# Patient Record
Sex: Male | Born: 1985 | Race: White | Hispanic: No | Marital: Married | Smoking: Never smoker
Health system: Southern US, Community
[De-identification: ages and names within clinical notes are randomized; demographics above are authoritative.]

## PROBLEM LIST (undated history)

## (undated) HISTORY — PX: ANTERIOR CRUCIATE LIGAMENT REPAIR: SHX115

---
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
## 2004-02-12 ENCOUNTER — Ambulatory Visit (HOSPITAL_BASED_OUTPATIENT_CLINIC_OR_DEPARTMENT_OTHER): Admission: RE | Admit: 2004-02-12 | Discharge: 2004-02-12 | Payer: Self-pay | Admitting: Orthopaedic Surgery

## 2006-08-10 ENCOUNTER — Ambulatory Visit: Payer: Self-pay | Admitting: Internal Medicine

## 2008-12-26 ENCOUNTER — Emergency Department (HOSPITAL_COMMUNITY): Admission: EM | Admit: 2008-12-26 | Discharge: 2008-12-26 | Payer: Self-pay | Admitting: Emergency Medicine

## 2009-06-28 ENCOUNTER — Emergency Department (HOSPITAL_COMMUNITY): Admission: EM | Admit: 2009-06-28 | Discharge: 2009-06-28 | Payer: Self-pay | Admitting: Emergency Medicine

## 2011-05-12 NOTE — Op Note (Signed)
NAMEHORICE, CARRERO NO.:  [id number]   MEDICAL RECORD NO.:  [id number]                   PATIENT TYPE:  AMB   LOCATION:  DSC                                  FACILITY:  [facility]   PHYSICIAN:  Claude Manges. Cleophas Dunker, M.D.            DATE OF BIRTH:  01-18-1986   DATE OF PROCEDURE:  02/12/2004  DATE OF DISCHARGE:                                 OPERATIVE REPORT   PREOPERATIVE DIAGNOSIS:  Retained foreign body (presumed meniscal dart) left  knee.   POSTOPERATIVE DIAGNOSIS:  Retained foreign body (presumed meniscal dart)  left knee.   PROCEDURE:  Excision of meniscal dart (foreign body) left knee.   SURGEON:  Claude Manges. Cleophas Dunker, M.D.   ANESTHESIA:  Local 0.25% Marcaine with epinephrine and IV sedation.   COMPLICATIONS:  None.   HISTORY:  26 year old young man is four months status post ACL  reconstruction of his left knee in association with repair of a medial  meniscal tear using meniscal darts.  He has done very well in his  postoperative course and only developed a small lump or mass along the  medial aspect of his knee in the vicinity of the posterior knee joint.  The  mass has interfered with his activities, particularly his rehab and early  running activities.  The mass is freely mobile and I suspect is a retained  meniscal dart.   DESCRIPTION OF PROCEDURE:  With the patient comfortable on the operating  table and under minimal IV sedation, the left knee was prepped with DuraPrep  from the mid calf to the mid thigh.  Sterile draping was performed.  The  small mass was located in the posterior aspect of the medial joint line and  had been isolated by finger palpation and marking preoperatively with a  pencil.  About a 3/4 inch incision was outlined directly over the mass and  this area was infiltrated with 0.25% Marcaine with epinephrine.  A 15 blade  knife was used to incise the skin and then by blunt dissection, the soft  tissue was carefully  elevated off the mass.  Retractors were inserted.  The  capsule was then minimally incised and then the meniscal dart was clearly  visible and removed in one piece.  There was some mild inflammatory tissue  surrounding it which was excised, as well.  The wound was irrigated with  saline solution.  The subcu was closed with 2-0  Vicryl.  The skin was  closed with a running 4-0 subcuticular nylon with Steri-Strips over Benzoin.  A sterile, soft bulky dressing was applied.  The patient tolerated the  procedure without complications.   PLAN:  Vicodin for pain.  He is a Consulting civil engineer at [employer] and hopefully  they can remove the stitch in approximately a week and then he can continue  with his ACL rehab.  Claude Manges. Cleophas Dunker, M.D.    PWW/MEDQ  D:  02/12/2004  T:  02/12/2004  Job:  [id number]

## 2011-05-12 NOTE — Op Note (Signed)
NAMEMANDELL, PANGBORN NO.:  [id number]   MEDICAL RECORD NO.:  [id number]                   PATIENT TYPE:  AMB   LOCATION:  DSC                                  FACILITY:  [facility]   PHYSICIAN:  Claude Manges. Cleophas Dunker, M.D.            DATE OF BIRTH:  1986-12-25   DATE OF PROCEDURE:  10/09/2003  DATE OF DISCHARGE:                                 OPERATIVE REPORT   PREOPERATIVE DIAGNOSIS:  Tear medial meniscus anterior of anterior cruciate  ligament, left knee.   POSTOPERATIVE DIAGNOSIS:  Tear medial meniscus anterior of anterior cruciate  ligament, left knee.   PROCEDURE:  1. Arthroscopic repair of medial meniscus, left knee.  2. Arthroscopically aided autologous anterior cruciate ligament bone-tendon-     bone reconstruction, (middle third patellar tendon).   SURGEON:  Claude Manges. Cleophas Dunker, M.D.   ASSISTANTDyke Brackett, M.D.   ANESTHESIA:  General orotracheal.   COMPLICATIONS:  None.   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  He has had an MRI scan revealing a tear of the medial meniscus and a  tear of the ACL.  He has been [place] his knee and has now reached full  range of motion and is to have an ACL repair as well as repair of the medial  meniscal tear.   DESCRIPTION OF PROCEDURE:  With the patient comfortable on the operating  room table and under general orotracheal anesthesia, the thigh tourniquet  and thigh holder were applied.  The leg was then prepped with Duraprep from  the thigh holder to the ankle, sterile draping was performed.   The extremity was elevated and Esmarch exsanguinated with the proximal  tourniquet at 350 mmHg.   Diagnostic arthroscopy was performed.  There was minimal effusion though I  did not see any loose bodies.  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  The patella  and both gutters were clear. The  lateral compartment was clear of meniscal pathology or chondromalacia.  The  ACL was torn from its femoral attachment and very thin, although he did have  only a 1 or 2 mm anterior drawer sign.  There was a tear of the medial  meniscus from its mid portion to the posterior horn.  I could easily insert  the probe and dislodge the meniscus.  It was just barely in the white zone  although I could see some vascular structures within the periphery.  The  clear fix arthroscopic screws were used to repair.  We thought we had a very  nice repair, three in total were used.   The ACL was then prepared for reconstruction.  A notch plasty was performed,  removing the old fibers of the ACL and using the hooded bur to remove bone  from the lateral femoral condyle.  The Arthrex guide system was utilized.   An about a 2 inch incision was made in the midline between the inferior pole  of the patella and tibial tubercle.  Very sharp dissection was taken down  the subcutaneous tissue.  The soft tissue and sheath were bluntly dissected  off the patellar ligament.  The patellar ligament measured a little over 30  mm in width, curling in the middle third was utilized for the graft for bone  on either end.  The double-bladed 10 mm knife blade was then used to make  the initial cut and then the oscillating saw used to remove the bone plugs  from the distal patella and the proximal tibia.  An osteotome was used to  dislodge the bony fragments.  We had a very nice graft, measured 10 mm plugs  at both ends and 90 mm in length and 10 mm wide.  Fibrewire was attached x2  in both bone plugs.   The Arthrex guide was then placed in the medial parapatellar incision.  We  had excellent position.  The guide pin was inserted and the 10 mm hole made.  The femoral guide was then inserted and the bone was very hard in the back  so we continued a more extensive notch plasty so that we could  see perfectly  in the back.  The guide was then inserted and the deep guide pin was then  inserted.  The 10 mm drill was utilized to make a foot print.  We checked to  see that we had full bone and then a 32 or 33 mm drill hole was then made.  We again checked and felt we had perfectly good bone circumferentially.   The graft was then, Fibrewires were then placed through the eye of the beeth  needle and then placed through the tibial notch, through the joint into the  femoral notch.  We thought we had very nice position, it was nice and tight  and no impingement.  A Knightenell guide pin was then placed through the fat  pad and a 7 x 20 mm screw was inserted.  It fit very nicely and it was nice  and tight.  With the knee in about 30 degrees of flexion and the posterior  drawer sign and tension on the fibers through the tibial plug, the guide pin  was inserted and a 7 x 25 mm metallic interference screw was then inserted.  We checked the joint, thought we had excellent position of the graft.  There  was negative anterior drawer sign, no impingement.  At that point, the  tourniquet was deflated.  The immediate capillary refill to the operative  site.  The patellar tendon sheath was closed with 2-0 Vicryl, the  subcutaneous with 2-0 Vicryl and the skin closed with running 3-0  subcuticular Prolene with Steri-Strips over Benzoin.  A 0.25% Marcaine was  injected into the joint, the incision and the puncture sites.  The patient  did receive a femoral nerve block preoperatively.  A sterile bulky dressing  was applied followed by an Ace bandage and an ice pack and a knee  immobilizer.   The patient tolerated the procedure well without complications.  Claude Manges. Cleophas Dunker, M.D.    PWW/MEDQ  D:  10/09/2003  T:  10/09/2003  Job:  [id number]

## 2011-05-12 NOTE — Assessment & Plan Note (Signed)
[facility]                               PULMONARY OFFICE NOTE   BRECKEN, DEWOODY                         MRN:          [id number]  DATE:08/10/2006                            DOB:          Sep 28, 1986    ALLERGY EVALUATION   DATE OF VISIT:  August 10, 2006.   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  In late May, he  recognized a discomfort with pressure in his ears and an enlarged gland in  the right anterior neck, but no sore throat or fever that he remembers.  Because it was spring, he assumed it might be a allergy.  Over the last  two months, he has had recurrent episodes of mild dizziness or  lightheadedness followed by a right temporal headache and occasional  photophobia.  He was having episodic difficulty reading and concentrating  such that he dropped out of the second session of summer school.  He went to  an eye doctor thinking it might be a vision problem and was told exam was  negative, but maybe its allergy.  He says his right eye drifts a little  and he puts a little lateral tug on the eyelid trying to relieve the  sensation.  He says blood work was normal twice at [facility] and also at  least once at Dr. Blair Heys office.  The right cervical lymph node has gone  down.  He has not had significant itching, sneezing, nasal discharge, or  other related symptoms.  Rest and Excedrin give some relief.   REVIEW OF SYSTEMS:  Mild dyspnea at rest.  He denies syncope, lateralizing  findings other than as described, numbness, or clumsiness.  He admits to  feeling stressed.  Occasionally mildly disoriented.  These episodes  apparently are fairly self-limited, and he has recognized no pattern of  fatigue, time of day, or circumstance otherwise.  Weight has been stable.  He has noted a little dry cough but not enough that  he paid attention to it  till I asked, and he has not recognized other constitutional symptoms.   PAST MEDICAL HISTORY:  Knee surgery repair.  No other significant health  problems.  No history of asthma, urticaria, unusual reactions to insects or  to foods, no intolerance of latex, contrast dye, or aspirin.   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  Has done some local travel at the  beach and mountains.   FAMILY HISTORY:  Sister with allergic rhinitis, mother died of breast  cancer.   OBJECTIVE:  VITAL SIGNS:  Weight 173 pounds, BP 122/80, pulse regular at 70,  room air saturation 98%.  GENERAL:  This is a well-developed, well-nourished, tanned, muscular,  healthy-appearing young man.  NEUROLOGIC:  Unremarkable to observation.  SKIN:  No rash.  ADENOPATHY:  None found with particular attention to the cervical nodes.  HEENT:  Conjunctivae are clear, pupils are equal  and reactive, nasal airway  is unobstructed, pharynx is somewhat reddened without visible drainage or  exudates, voice quality is normal, there is no stridor, neck vein  distention, or thyromegaly.  Tympanic membranes are clear.  CHEST:  Quiet, clear lung sounds.  Heart sounds are regular without murmur  or gallop.  ABDOMEN:  No enlargement of liver or spleen.  EXTREMITIES:  No tremor, cyanosis, clubbing, or edema.   IMPRESSION:  1. I doubt his symptoms are on the basis of an allergic problem,      particularly a standard environmental allergic rhinitis, but I did      offer to let him try some samples of commonly used allergy medications      to see if he noted any impact.  2. My first concern would be that this is either a vascular      headache/migraine, or some other neurologic process.  In particular, I      do not like the issues of difficulty concentrating and the recurrence      of localized headache.  He is going to return to [facility] with his experience with the allergy samples I have given      him, prepared to talk with them about seeking neurologic consultation      unless he does that locally with Dr. Artis Flock.   PLAN:  1. We are not going to do allergy skin testing for now.  2. Try sample Veramyst one spray each nostril daily, try sample Singulair      10 mg daily, try Claritin OTC once daily for one to two weeks.  If      there are breakthrough events or any clues from his response to this      therapy, he will discuss it with [facility].  3. Return here p.r.n.                                   Clinton D. Maple Hudson, MD, FCCP, FACP   CDY/MedQ  DD:  08/11/2006  DT:  08/11/2006  Job #:  [id number]   cc:   Quita Skye. Artis Flock, MD

## 2011-06-01 ENCOUNTER — Other Ambulatory Visit (INDEPENDENT_AMBULATORY_CARE_PROVIDER_SITE_OTHER): Payer: Self-pay | Admitting: Otolaryngology

## 2011-06-08 ENCOUNTER — Other Ambulatory Visit (HOSPITAL_COMMUNITY): Payer: Self-pay

## 2011-06-12 ENCOUNTER — Ambulatory Visit (HOSPITAL_COMMUNITY)
Admission: RE | Admit: 2011-06-12 | Discharge: 2011-06-12 | Disposition: A | Discharge status: Home / Self Care | Payer: BC Managed Care – PPO | Source: Ambulatory Visit | Attending: Otolaryngology | Admitting: Otolaryngology

## 2011-06-12 DIAGNOSIS — R1310 Dysphagia, unspecified: Secondary | ICD-10-CM | POA: Insufficient documentation

## 2013-04-18 ENCOUNTER — Emergency Department (HOSPITAL_COMMUNITY)
Admission: EM | Admit: 2013-04-18 | Discharge: 2013-04-18 | Disposition: A | Discharge status: Home / Self Care | Payer: 59 | Source: Home / Self Care

## 2013-04-18 ENCOUNTER — Encounter (HOSPITAL_COMMUNITY): Payer: Self-pay | Admitting: Emergency Medicine

## 2013-04-18 DIAGNOSIS — B029 Zoster without complications: Secondary | ICD-10-CM

## 2013-04-18 MED ORDER — GABAPENTIN 300 MG PO CAPS: ORAL_CAPSULE | ORAL | Status: DC

## 2013-04-18 MED ORDER — VALACYCLOVIR HCL 1 G PO TABS: 1000.0000 mg | ORAL_TABLET | Freq: Three times a day (TID) | ORAL | Status: AC

## 2013-04-18 MED ORDER — HYDROCODONE-ACETAMINOPHEN 7.5-325 MG PO TABS: 1.0000 | ORAL_TABLET | ORAL | Status: DC | PRN

## 2013-04-18 NOTE — ED Provider Notes (Signed)
History     CSN: [id number]  Arrival date & time 04/18/13  1517   None     Chief Complaint  Patient presents with  . Rash    rash on lower right back with pain x 3 to 4 days. used cortisone cream with no relief.     (Consider location/radiation/quality/duration/timing/severity/associated sxs/prior treatment) HPI Comments: 27 year old male developed pain in the right lower back radiating laterally to the right mid abdomen. The area was tende. the pain was worse when lying on his right side. 2 days ago developed a papular vesicular rash in the dermatome.    History reviewed. No pertinent past medical history.  Past Surgical History  Procedure Laterality Date  . Anterior cruciate ligament repair      History reviewed. No pertinent family history.  History  Substance Use Topics  . Smoking status: Never Smoker   . Smokeless tobacco: Not on file  . Alcohol Use: Yes      Review of Systems  Skin:       As per history of present illness  All other systems reviewed and are negative.    Allergies  Review of patient's allergies indicates no known allergies.  Home Medications   Current Outpatient Rx  Name  Route  Sig  Dispense  Refill  . gabapentin (NEURONTIN) 300 MG capsule      1 tab po at bedtime 2 days, 1 tablet bid second day for 2 days, then 1 tablet tid   30 capsule   0   . HYDROcodone-acetaminophen (NORCO) 7.5-325 MG per tablet   Oral   Take 1 tablet by mouth every 4 (four) hours as needed for pain.   15 tablet   0   . valACYclovir (VALTREX) 1000 MG tablet   Oral   Take 1 tablet (1,000 mg total) by mouth 3 (three) times daily.   21 tablet   0     BP 147/87  Pulse 65  Temp(Src) 98.5 F (36.9 C)  Resp 20  Physical Exam  Nursing note and vitals reviewed. Constitutional: He is oriented to person, place, and time. He appears well-developed and well-nourished.  Eyes: EOM are normal.  Neck: Normal range of motion. Neck supple.  Pulmonary/Chest:  Effort normal.  Musculoskeletal: He exhibits no edema and no tenderness.  Neurological: He is alert and oriented to person, place, and time. He exhibits normal muscle tone.  Skin: Skin is warm and dry.  Red primarily papular rash tender to touch along the T12-L2 dermatome in the right back radiating to the right anterior abdomen just beyond the right anterior axillary line.  Psychiatric: He has a normal mood and affect.    ED Course  Procedures (including critical care time)  Labs Reviewed - No data to display No results found.   1. Herpes zoster       MDM   Valtrex 2000 3 times a day for 7 days Norco 7.5 every 4 hours when necessary pain #15 Gabapentin 300 mg one each bedtime x3 days, then one twice a day for 2 days then 1tab 3 times a day.    Hayden Rasmussen, NP 04/18/13 1710

## 2013-04-18 NOTE — ED Notes (Signed)
Pt c/o rash of right lower back with pain that is gradually getting worse.  Rash is red and irritated.  Pt has used cortisone cream with no relief in symptoms.

## 2013-04-18 NOTE — ED Provider Notes (Signed)
Medical screening examination/treatment/procedure(s) were performed by resident physician or non-physician practitioner and as supervising physician I was immediately available for consultation/collaboration.   Zelene Barga DOUGLAS MD.   Alicen Donalson D Derek Huneycutt, MD 04/18/13 2025 

## 2015-11-01 ENCOUNTER — Telehealth: Payer: Self-pay | Admitting: General Practice

## 2015-11-01 ENCOUNTER — Ambulatory Visit (INDEPENDENT_AMBULATORY_CARE_PROVIDER_SITE_OTHER): Payer: 59 | Admitting: Family Medicine

## 2015-11-01 ENCOUNTER — Encounter: Payer: Self-pay | Admitting: Family Medicine

## 2015-11-01 VITALS — BP 118/71 | HR 71 | Temp 98.4°F | Ht 68.0 in | Wt 198.0 lb

## 2015-11-01 DIAGNOSIS — J209 Acute bronchitis, unspecified: Secondary | ICD-10-CM | POA: Diagnosis not present

## 2015-11-01 MED ORDER — HYDROCODONE-HOMATROPINE 5-1.5 MG/5ML PO SYRP: 5.0000 mL | ORAL_SOLUTION | ORAL | Status: DC | PRN

## 2015-11-01 MED ORDER — AZITHROMYCIN 250 MG PO TABS: ORAL_TABLET | ORAL | Status: DC

## 2015-11-01 NOTE — Telephone Encounter (Signed)
Pt has been scheduled.  °

## 2015-11-01 NOTE — Progress Notes (Signed)
   Subjective:    Patient ID: Nelly Routodd Convery, male    DOB: 04-Jan-1986, 29 y.o.   MRN: [id number]  HPI Here for 6 days of fever to 101 degrees, body aches, and a cough producing yellow sputum.    Review of Systems  Constitutional: Positive for fever.  HENT: Positive for congestion, postnasal drip and sore throat. Negative for sinus pressure.   Eyes: Negative.   Respiratory: Positive for cough and chest tightness. Negative for shortness of breath and wheezing.   Cardiovascular: Negative.   Gastrointestinal: Negative.        Objective:   Physical Exam  Constitutional: He appears well-developed and well-nourished.  HENT:  Right Ear: External ear normal.  Left Ear: External ear normal.  Nose: Nose normal.  Mouth/Throat: Oropharynx is clear and moist.  Eyes: Conjunctivae are normal.  Neck: No thyromegaly present.  Cardiovascular: Normal rate, regular rhythm, normal heart sounds and intact distal pulses.   Pulmonary/Chest: Effort normal. No respiratory distress. He has no wheezes. He has no rales.  Scattered rhonchi   Lymphadenopathy:    He has no cervical adenopathy.          Assessment & Plan:  Viral illness with a secondary bronchitis. Treat with a Zpack

## 2015-11-01 NOTE — Telephone Encounter (Signed)
Per Dr. Clent RidgesFry okay to schedule for sick visit.

## 2015-11-01 NOTE — Progress Notes (Signed)
Pre visit review using our clinic review tool, if applicable. No additional management support is needed unless otherwise documented below in the visit note. 

## 2015-11-01 NOTE — Telephone Encounter (Signed)
Patient's wife said Dr. Clent RidgesFry would take the patient on as a new patient, but he is sick with a fever and bad cough now and she wants to know if Dr. Clent RidgesFry would take him for a sick visit before his new patient appointment.

## 2015-11-22 ENCOUNTER — Encounter: Payer: Self-pay | Admitting: Family Medicine

## 2015-11-22 ENCOUNTER — Ambulatory Visit (INDEPENDENT_AMBULATORY_CARE_PROVIDER_SITE_OTHER): Payer: 59 | Admitting: Family Medicine

## 2015-11-22 VITALS — BP 106/69 | HR 73 | Temp 98.4°F | Ht 68.0 in | Wt 197.0 lb

## 2015-11-22 DIAGNOSIS — R740 Nonspecific elevation of levels of transaminase and lactic acid dehydrogenase [LDH]: Secondary | ICD-10-CM

## 2015-11-22 DIAGNOSIS — R7401 Elevation of levels of liver transaminase levels: Secondary | ICD-10-CM

## 2015-11-22 DIAGNOSIS — E785 Hyperlipidemia, unspecified: Secondary | ICD-10-CM

## 2015-11-22 NOTE — Progress Notes (Signed)
Pre visit review using our clinic review tool, if applicable. No additional management support is needed unless otherwise documented below in the visit note. 

## 2015-11-22 NOTE — Progress Notes (Signed)
   Subjective:    Patient ID: Christian Thornton, male    DOB: [birth year], 29 y.o.   MRN: [id number]  HPI 29 yr old male to establish with us. We saw him a few weeks ago for a bronchitis, and this as resolved. He feels fine. He recently had a full cpx at work along with labs, and apparently this came out okay. His first set of labs showed a mildly elevated lipid panel and one liver enzyme was a bit high. He adjusted is diet and on a recheck 90 days later, these values were all normal.    Review of Systems  Constitutional: Negative.   Respiratory: Negative.   Cardiovascular: Negative.   Gastrointestinal: Negative.   Endocrine: Negative.   Genitourinary: Negative.   Neurological: Negative.        Objective:   Physical Exam  Constitutional: He is oriented to person, place, and time. He appears well-developed and well-nourished.  Neck: No thyromegaly present.  Cardiovascular: Normal rate, regular rhythm, normal heart sounds and intact distal pulses.   Pulmonary/Chest: Effort normal and breath sounds normal.  Abdominal: Soft. Bowel sounds are normal. He exhibits no distension and no mass. There is no tenderness. There is no rebound and no guarding.  Lymphadenopathy:    He has no cervical adenopathy.  Neurological: He is alert and oriented to person, place, and time.          Assessment & Plan:  Introductory visit. He seems to be healthy. He will brings us a copy of his recent exam and labs.

## 2016-05-24 ENCOUNTER — Encounter: Payer: Self-pay | Admitting: Family Medicine

## 2016-05-24 ENCOUNTER — Ambulatory Visit (INDEPENDENT_AMBULATORY_CARE_PROVIDER_SITE_OTHER): Payer: 59 | Admitting: Family Medicine

## 2016-05-24 VITALS — BP 150/76 | HR 75 | Temp 98.5°F | Ht 68.0 in | Wt 199.0 lb

## 2016-05-24 DIAGNOSIS — F411 Generalized anxiety disorder: Secondary | ICD-10-CM | POA: Diagnosis not present

## 2016-05-24 DIAGNOSIS — F1010 Alcohol abuse, uncomplicated: Secondary | ICD-10-CM | POA: Diagnosis not present

## 2016-05-24 DIAGNOSIS — R109 Unspecified abdominal pain: Secondary | ICD-10-CM

## 2016-05-24 LAB — POC URINALSYSI DIPSTICK (AUTOMATED)
BILIRUBIN UA: NEGATIVE
Glucose, UA: NEGATIVE
KETONES UA: NEGATIVE
LEUKOCYTES UA: NEGATIVE
NITRITE UA: NEGATIVE
PH UA: 7
PROTEIN UA: NEGATIVE
RBC UA: NEGATIVE
Spec Grav, UA: <=1.005
Urobilinogen, UA: 0.2

## 2016-05-24 NOTE — Progress Notes (Signed)
Pre visit review using our clinic review tool, if applicable. No additional management support is needed unless otherwise documented below in the visit note. 

## 2016-05-25 ENCOUNTER — Encounter: Payer: Self-pay | Admitting: Family Medicine

## 2016-05-25 LAB — CBC WITH DIFFERENTIAL/PLATELET
BASOS PCT: 0.7 % (ref 0.0–3.0)
Basophils Absolute: 0.1 10*3/uL (ref 0.0–0.1)
EOS PCT: 0.9 % (ref 0.0–5.0)
Eosinophils Absolute: 0.1 10*3/uL (ref 0.0–0.7)
HEMATOCRIT: 45.7 % (ref 39.0–52.0)
HEMOGLOBIN: 15.6 g/dL (ref 13.0–17.0)
LYMPHS PCT: 22.5 % (ref 12.0–46.0)
Lymphs Abs: 2 10*3/uL (ref 0.7–4.0)
MCHC: 34.2 g/dL (ref 30.0–36.0)
MCV: 87 fl (ref 78.0–100.0)
MONOS PCT: 7.2 % (ref 3.0–12.0)
Monocytes Absolute: 0.6 10*3/uL (ref 0.1–1.0)
NEUTROS ABS: 6.2 10*3/uL (ref 1.4–7.7)
Neutrophils Relative %: 68.7 % (ref 43.0–77.0)
PLATELETS: 267 10*3/uL (ref 150.0–400.0)
RBC: 5.25 Mil/uL (ref 4.22–5.81)
RDW: 12.6 % (ref 11.5–15.5)
WBC: 9 10*3/uL (ref 4.0–10.5)

## 2016-05-25 LAB — HEPATIC FUNCTION PANEL
ALBUMIN: 4.9 g/dL (ref 3.5–5.2)
ALT: 38 U/L (ref 0–53)
AST: 32 U/L (ref 0–37)
Alkaline Phosphatase: 54 U/L (ref 39–117)
Bilirubin, Direct: 0.2 mg/dL (ref 0.0–0.3)
TOTAL PROTEIN: 8 g/dL (ref 6.0–8.3)
Total Bilirubin: 1 mg/dL (ref 0.2–1.2)

## 2016-05-25 LAB — BASIC METABOLIC PANEL
BUN: 10 mg/dL (ref 6–23)
CHLORIDE: 99 meq/L (ref 96–112)
CO2: 30 meq/L (ref 19–32)
CREATININE: 1.01 mg/dL (ref 0.40–1.50)
Calcium: 9.8 mg/dL (ref 8.4–10.5)
GFR: 91.36 mL/min (ref >60.00)
Glucose, Bld: 92 mg/dL (ref 70–99)
POTASSIUM: 4 meq/L (ref 3.5–5.1)
SODIUM: 137 meq/L (ref 135–145)

## 2016-05-25 NOTE — Progress Notes (Signed)
   Subjective:    Patient ID: Christian Thornton, male    DOB: 1986-02-03, 30 y.o.   MRN: [id number]  HPI Here asking about pain in the right side that started about 2 weeks ago. Moving certain ways brings out the pain. No urinary or bowel complaints. He has been playing a lot of golf for the past 8 months. He is also worried about his recent alcohol use. He says he has been dealing with a lot of stress and he as been drinking more alcohol than usual on a daily basis. He asks about better ways to deal with stress. He has some mild depression symptoms like sadness but he mostly has anxiety symptoms like worrying about finances, his job, etc. Sleep and appetite are good.    Review of Systems  Constitutional: Negative.   Respiratory: Negative.   Cardiovascular: Negative.   Gastrointestinal: Negative.   Genitourinary: Positive for flank pain. Negative for dysuria, urgency, frequency, hematuria and difficulty urinating.  Neurological: Negative.   Psychiatric/Behavioral: Negative for suicidal ideas, hallucinations, behavioral problems, confusion, sleep disturbance, self-injury, dysphoric mood, decreased concentration and agitation. The patient is nervous/anxious. The patient is not hyperactive.        Objective:   Physical Exam  Constitutional: He is oriented to person, place, and time. He appears well-developed and well-nourished. No distress.  Eyes: No scleral icterus.  Neck: No thyromegaly present.  Cardiovascular: Normal rate, regular rhythm, normal heart sounds and intact distal pulses.   Pulmonary/Chest: Effort normal and breath sounds normal.  Abdominal: Soft. Bowel sounds are normal. He exhibits no distension and no mass. There is no rebound and no guarding.  No HSM. He is tender along the right ribs and right flank, and twisting the trunk causes some discomfort  Lymphadenopathy:    He has no cervical adenopathy.  Neurological: He is alert and oriented to person, place, and time.    Psychiatric: He has a normal mood and affect. His behavior is normal. Thought content normal.          Assessment & Plan:  His side pain is muscular and I suspect he strained a muscle playing golf. He will stop golf for awhile and rest to let this heal. At his request we will get labs today including a liver panel to see if is alcohol use has caused any problems. I agreed with him that there are better ways to deal with stress and taking to a therapist is a good alternative. I gave him some information about [facility] and he will contact them.  Nelwyn SalisburyFRY,STEPHEN A, MD

## 2016-09-27 ENCOUNTER — Ambulatory Visit (INDEPENDENT_AMBULATORY_CARE_PROVIDER_SITE_OTHER): Payer: Commercial Managed Care - PPO | Admitting: Family Medicine

## 2016-09-27 ENCOUNTER — Encounter: Payer: Self-pay | Admitting: Family Medicine

## 2016-09-27 VITALS — BP 125/78 | HR 98 | Temp 97.9°F | Ht 68.0 in | Wt 202.0 lb

## 2016-09-27 DIAGNOSIS — J209 Acute bronchitis, unspecified: Secondary | ICD-10-CM

## 2016-09-27 MED ORDER — HYDROCODONE-HOMATROPINE 5-1.5 MG/5ML PO SYRP: 5.0000 mL | ORAL_SOLUTION | ORAL | 0 refills | Status: DC | PRN

## 2016-09-27 MED ORDER — AZITHROMYCIN 250 MG PO TABS: ORAL_TABLET | ORAL | 0 refills | Status: DC

## 2016-09-27 NOTE — Progress Notes (Signed)
Pre visit review using our clinic review tool, if applicable. No additional management support is needed unless otherwise documented below in the visit note. 

## 2016-09-27 NOTE — Progress Notes (Signed)
   Subjective:    Patient ID: Christian Thornton, male    DOB: [birth year], 30 y.o.   MRN: [id number]  HPI Here for 4 days of stuffy head, PND, and coughing up green sputum. He had Thornton fever at first but not now.    Review of Systems  Constitutional: Negative.   HENT: Positive for congestion and postnasal drip. Negative for ear pain, sinus pressure and sore throat.   Respiratory: Positive for cough.        Objective:   Physical Exam  Constitutional: He appears well-developed and well-nourished.  HENT:  Right Ear: External ear normal.  Left Ear: External ear normal.  Nose: Nose normal.  Mouth/Throat: Oropharynx is clear and moist.  Eyes: Conjunctivae are normal.  Neck: No thyromegaly present.  Pulmonary/Chest: Effort normal. No respiratory distress. He has no wheezes. He has no rales.  Scattered rhonchi   Lymphadenopathy:    He has no cervical adenopathy.          Assessment & Plan:  Bronchitis, treat with Thornton Zpack.  Nelwyn SalisburyFRY,Christian Beaulieu A, MD

## 2017-11-20 ENCOUNTER — Encounter: Payer: Self-pay | Admitting: Family Medicine

## 2017-11-20 ENCOUNTER — Ambulatory Visit (INDEPENDENT_AMBULATORY_CARE_PROVIDER_SITE_OTHER): Payer: Self-pay | Admitting: Family Medicine

## 2017-11-20 VITALS — BP 136/80 | HR 102 | Temp 99.0°F | Wt 200.6 lb

## 2017-11-20 DIAGNOSIS — J018 Other acute sinusitis: Secondary | ICD-10-CM

## 2017-11-20 DIAGNOSIS — R6889 Other general symptoms and signs: Secondary | ICD-10-CM

## 2017-11-20 LAB — POC INFLUENZA A&B (BINAX/QUICKVUE): Influenza A, POC: NEGATIVE

## 2017-11-20 MED ORDER — AZITHROMYCIN 250 MG PO TABS: ORAL_TABLET | ORAL | 0 refills | Status: AC

## 2017-11-20 NOTE — Progress Notes (Signed)
   Subjective:    Patient ID: Christian Thornton, male    DOB: Mar 10, 1986, 31 y.o.   MRN: [id number]  HPI Here for 3 days of fever, body aches, ST, sinus congestion and PND. No real cough. Using Mucinex and Tylenol.    Review of Systems  Constitutional: Positive for fever.  HENT: Positive for congestion, postnasal drip, sinus pressure, sinus pain and sore throat. Negative for ear pain.   Eyes: Negative.   Respiratory: Negative.   Musculoskeletal: Positive for myalgias.  Neurological: Positive for headaches.       Objective:   Physical Exam  Constitutional: He is oriented to person, place, and time. He appears well-developed and well-nourished.  HENT:  Right Ear: External ear normal.  Left Ear: External ear normal.  Nose: Nose normal.  Mouth/Throat: Oropharynx is clear and moist.  Eyes: Conjunctivae are normal.  Neck: No thyromegaly present.  Pulmonary/Chest: Effort normal and breath sounds normal. No respiratory distress. He has no wheezes. He has no rales.  Lymphadenopathy:    He has no cervical adenopathy.  Neurological: He is alert and oriented to person, place, and time.          Assessment & Plan:  Sinusitis, treat with a Zpack.  Gershon CraneStephen Shaquna Geigle, MD

## 2018-08-09 ENCOUNTER — Encounter (INDEPENDENT_AMBULATORY_CARE_PROVIDER_SITE_OTHER): Payer: Self-pay | Admitting: Orthopaedic Surgery

## 2018-08-09 ENCOUNTER — Ambulatory Visit (INDEPENDENT_AMBULATORY_CARE_PROVIDER_SITE_OTHER): Payer: BLUE CROSS/BLUE SHIELD | Admitting: Orthopaedic Surgery

## 2018-08-09 ENCOUNTER — Ambulatory Visit (INDEPENDENT_AMBULATORY_CARE_PROVIDER_SITE_OTHER): Payer: Self-pay

## 2018-08-09 VITALS — BP 146/88 | HR 78 | Ht 69.0 in | Wt 190.0 lb

## 2018-08-09 DIAGNOSIS — M25512 Pain in left shoulder: Secondary | ICD-10-CM

## 2018-08-09 DIAGNOSIS — M7542 Impingement syndrome of left shoulder: Secondary | ICD-10-CM

## 2018-08-09 MED ORDER — BUPIVACAINE HCL 0.5 % IJ SOLN
2.0000 mL | INTRAMUSCULAR | Status: AC | PRN
  Administered 2018-08-09: 2 mL via INTRA_ARTICULAR

## 2018-08-09 MED ORDER — METHYLPREDNISOLONE ACETATE 40 MG/ML IJ SUSP
80.0000 mg | INTRAMUSCULAR | Status: AC | PRN
  Administered 2018-08-09: 80 mg

## 2018-08-09 MED ORDER — LIDOCAINE HCL 2 % IJ SOLN
2.0000 mL | INTRAMUSCULAR | Status: AC | PRN
  Administered 2018-08-09: 2 mL

## 2018-08-09 NOTE — Progress Notes (Signed)
Office Visit Note   Patient: Christian Thornton           Date of Birth: 07-25-1986           MRN: [id number] Visit Date: 08/09/2018              Requested by: Christian Thornton, Christian A, MD [address] [place] [place], [place] [id number] PCP: Christian Thornton, Christian A, MD   Assessment & Plan: Visit Diagnoses:  1. Acute pain of left shoulder   2. Impingement syndrome of left shoulder     Plan: Impingement syndrome left shoulder.  We will try a subacromial cortisone injection and monitor his response. Follow-Up Instructions: Return if symptoms worsen or fail to improve.   Orders:  Orders Placed This Encounter  Procedures  . Large Joint Inj: L subacromial bursa  . XR Shoulder Left   No orders of the defined types were placed in this encounter.     Procedures: Large Joint Inj: L subacromial bursa on 08/09/2018 4:27 PM Indications: pain and diagnostic evaluation Details: 25 G 1.5 in needle, anterolateral approach  Arthrogram: No  Medications: 2 mL lidocaine 2 %; 2 mL bupivacaine 0.5 %; 80 mg methylPREDNISolone acetate 40 MG/ML Consent was given by the patient. Immediately prior to procedure a time out was called to verify the correct patient, procedure, equipment, support staff and site/side marked as required. Patient was prepped and draped in the usual sterile fashion.       Clinical Data: No additional findings.   Subjective: Chief Complaint  Patient presents with  . New Patient (Initial Visit)    07/31/18 L SHOULDER PAIN AGGRAVIATED WHILE PLAYING GOLF. HURTS WHEN LIFTS ARM OVER SHOULDER HEIGHT  Christian Coolerodd is 32 years old and visited the office for evaluation of left shoulder pain.  He denies any history of injury or trauma but has been active athletically and even recently and lifting weights.  He has not had any acute episodes but has just had some difficulties insidiously with sleeping and raising his arm over his head.  There is no referred pain.  No difficulty with cervical spine.  He tried  playing golf and notes that he is had some discomfort particularly when he abducts his arm.  HPI  Review of Systems  Constitutional: Negative for fatigue and fever.  HENT: Negative for ear pain.   Eyes: Negative for pain.  Respiratory: Negative for cough and shortness of breath.   Cardiovascular: Negative for leg swelling.  Gastrointestinal: Negative for diarrhea and nausea.  Genitourinary: Negative for difficulty urinating.  Musculoskeletal: Negative for back pain and neck pain.  Skin: Negative for rash.  Allergic/Immunologic: Negative for food allergies.  Neurological: Positive for weakness.  Hematological: Does not bruise/bleed easily.  Psychiatric/Behavioral: Negative for sleep disturbance.     Objective: Vital Signs: BP (!) 146/88 (BP Location: Right Arm, Patient Position: Sitting, Cuff Size: Normal)   Pulse 78   Ht 5\' 9"  (1.753 m)   Wt 190 lb (86.2 kg)   BMI 28.06 kg/m   Physical Exam  Constitutional: He is oriented to person, place, and time. He appears well-developed and well-nourished.  HENT:  Mouth/Throat: Oropharynx is clear and moist.  Eyes: Pupils are equal, round, and reactive to light. EOM are normal.  Pulmonary/Chest: Effort normal.  Neurological: He is alert and oriented to person, place, and time.  Skin: Skin is warm and dry.  Psychiatric: He has a normal mood and affect. His behavior is normal.    Ortho Exam awake alert  and oriented x3.  Comfortable sitting.  Positive impingement right shoulder with subacromial pain anteriorly and laterally.  Good strength.  No pain at the acromioclavicular joint.  No evidence of adhesive capsulitis.  Able to quickly raise his arm over his head but with some discomfort.  SSEPs intact  Specialty Comments:  No specialty comments available.  Imaging: Xr Shoulder Left  Result Date: 08/09/2018 Films of the left shoulder revealed no evidence of acute change.  Humeral head was centered about the glenoid.  No ectopic  calcification.  Sniffing and downsloping of the acromion.  AC joint intact.    PMFS History: There are no active problems to display for this patient.  History reviewed. No pertinent past medical history.  Family History  Problem Relation Age of Onset  . Breast cancer Unknown   . Breast cancer Mother     Past Surgical History:  Procedure Laterality Date  . ANTERIOR CRUCIATE LIGAMENT REPAIR Left    Social History   Occupational History  . Not on file  Tobacco Use  . Smoking status: Never Smoker  . Smokeless tobacco: Never Used  Substance and Sexual Activity  . Alcohol use: Yes    Alcohol/week: 0.0 standard drinks    Comment: occ  . Drug use: No  . Sexual activity: Yes

## 2018-12-30 ENCOUNTER — Ambulatory Visit (INDEPENDENT_AMBULATORY_CARE_PROVIDER_SITE_OTHER): Payer: BLUE CROSS/BLUE SHIELD | Admitting: Orthopaedic Surgery

## 2018-12-30 ENCOUNTER — Encounter (INDEPENDENT_AMBULATORY_CARE_PROVIDER_SITE_OTHER): Payer: Self-pay | Admitting: Orthopaedic Surgery

## 2018-12-30 DIAGNOSIS — G8929 Other chronic pain: Secondary | ICD-10-CM

## 2018-12-30 DIAGNOSIS — M25512 Pain in left shoulder: Secondary | ICD-10-CM | POA: Diagnosis not present

## 2018-12-30 NOTE — Progress Notes (Signed)
Office Visit Note   Patient: Christian Thornton           Date of Birth: [birth year]           MRN: [id number] Visit Date: 12/30/2018              Requested by: Nelwyn Salisbury, MD [address] [place], [place] [address] PCP: Nelwyn Salisbury, MD   Assessment & Plan: Visit Diagnoses:  1. Chronic left shoulder pain     Plan: Persistent left shoulder pain with the possibility of a labral tear. will order MRI arthrogram.  Long discussion regarding possible diagnosis and treatment options.  I think it is best at this point to establish a diagnosis given the chronicity of his problem  Follow-Up Instructions: Return after MRI arthrogram left shoulder.   Orders:  Orders Placed This Encounter  Procedures  . DL FLUORO GUIDED NEEDLE PLC ASPIRATION / INJECTTION/LOC  . MR Shoulder Left w/ contrast   No orders of the defined types were placed in this encounter.     Procedures: No procedures performed   Clinical Data: No additional findings.   Subjective: Chief Complaint  Patient presents with  . Left Shoulder - Follow-up  . Shoulder Pain    Left shoulder pain, hasn't improved since 07/2018 visit, limited range of motion,   Arun has had a problem with his left shoulder for many months.  I saw him in August formed a subacromial cortisone injection.  He is had some improvement.  Pain was localized along the anterior lateral aspect of his shoulder with certain movements particularly golf and with working with weights.  He has had some recurrence of the pain without any injury or trauma.  He still has some pain with certain motions and even had some trouble sleeping at night.  He is had some popping and clicking.  No numbness or tingling or neck pain  HPI  Review of Systems   Objective: Vital Signs: BP 135/89 (BP Location: Right Arm, Patient Position: Sitting, Cuff Size: Normal)   Pulse 83   Wt 201 lb (91.2 kg)   BMI 29.68 kg/m   Physical Exam Constitutional:      Appearance:  He is well-developed.  Eyes:     Pupils: Pupils are equal, round, and reactive to light.  Pulmonary:     Effort: Pulmonary effort is normal.  Skin:    General: Skin is warm and dry.  Neurological:     Mental Status: He is alert and oriented to person, place, and time.  Psychiatric:        Behavior: Behavior normal.     Ortho Exam awake alert and oriented x3.  Comfortable sitting.  He could easily place his left arm over his head.  Little bit of pain with external rotation of the shoulder in the impingement position.  Biceps intact skin intact.  No pain at the acromioclavicular joint.  Good strength.  I could not elicit any popping or clicking.  No evidence of instability  Specialty Comments:  No specialty comments available.  Imaging: No results found.   PMFS History: There are no active problems to display for this patient.  History reviewed. No pertinent past medical history.  Family History  Problem Relation Age of Onset  . Breast cancer Other   . Breast cancer Mother     Past Surgical History:  Procedure Laterality Date  . ANTERIOR CRUCIATE LIGAMENT REPAIR Left    Social History   Occupational History  .  Not on file  Tobacco Use  . Smoking status: Never Smoker  . Smokeless tobacco: Never Used  Substance and Sexual Activity  . Alcohol use: Yes    Alcohol/week: 0.0 standard drinks    Comment: occ  . Drug use: No  . Sexual activity: Yes     Valeria Batman, MD   Note - This record has been created using [employer].  Chart creation errors have been sought, but may not always  have been located. Such creation errors do not reflect on  the standard of medical care.

## 2018-12-30 NOTE — Progress Notes (Deleted)
Lt shoulder--still having pain movement, tightness.Marland KitchenMarland Kitchen

## 2019-01-02 ENCOUNTER — Ambulatory Visit
Admission: RE | Admit: 2019-01-02 | Discharge: 2019-01-02 | Disposition: A | Discharge status: Home / Self Care | Payer: BLUE CROSS/BLUE SHIELD | Source: Ambulatory Visit | Attending: Orthopaedic Surgery | Admitting: Orthopaedic Surgery

## 2019-01-02 DIAGNOSIS — M25512 Pain in left shoulder: Principal | ICD-10-CM

## 2019-01-02 DIAGNOSIS — G8929 Other chronic pain: Secondary | ICD-10-CM

## 2019-01-02 MED ORDER — IOPAMIDOL (ISOVUE-M 200) INJECTION 41%
15.0000 mL | Freq: Once | INTRAMUSCULAR | Status: AC
  Administered 2019-01-02: 15 mL via INTRA_ARTICULAR

## 2019-01-06 ENCOUNTER — Encounter (INDEPENDENT_AMBULATORY_CARE_PROVIDER_SITE_OTHER): Payer: Self-pay | Admitting: Orthopaedic Surgery

## 2019-01-06 ENCOUNTER — Ambulatory Visit (INDEPENDENT_AMBULATORY_CARE_PROVIDER_SITE_OTHER): Payer: BLUE CROSS/BLUE SHIELD | Admitting: Orthopaedic Surgery

## 2019-01-06 DIAGNOSIS — G8929 Other chronic pain: Secondary | ICD-10-CM

## 2019-01-06 DIAGNOSIS — M25512 Pain in left shoulder: Secondary | ICD-10-CM | POA: Diagnosis not present

## 2019-01-06 NOTE — Addendum Note (Signed)
Addended by: Shelly Bombard on: 01/06/2019 08:25 AM   Modules accepted: Orders

## 2019-01-06 NOTE — Progress Notes (Signed)
   Office Visit Note   Patient: Christian Thornton           Date of Birth: Nov 26, 1986           MRN: [id number] Visit Date: 01/06/2019              Requested by: Nelwyn Salisbury, MD [address] [place], [place] [address] PCP: Nelwyn Salisbury, MD   Assessment & Plan: Visit Diagnoses:  1. Chronic left shoulder pain     Plan: MRI scan reveals mild supraspinatus tendinitis.  Labrum was intact.  No biceps pathology.  Long discussion regarding the results.  I think a course of therapy may help.  We will set this up as needed  Follow-Up Instructions: Return if symptoms worsen or fail to improve.   Orders:  No orders of the defined types were placed in this encounter.  No orders of the defined types were placed in this encounter.     Procedures: No procedures performed   Clinical Data: No additional findings.   Subjective: No chief complaint on file. Mild change in symptoms.  Feeling a bit better since he had the MR arthrogram with slightly better range of motion and less pain  HPI  Review of Systems   Objective: Vital Signs: There were no vitals taken for this visit.  Physical Exam  Ortho Exam awake alert and oriented x3.  Comfortable sitting.  Able to place his arm fully over his head.  Minimally positive impingement testing.  Grip and good release  Specialty Comments:  No specialty comments available.  Imaging: No results found.   PMFS History: There are no active problems to display for this patient.  History reviewed. No pertinent past medical history.  Family History  Problem Relation Age of Onset  . Breast cancer Other   . Breast cancer Mother     Past Surgical History:  Procedure Laterality Date  . ANTERIOR CRUCIATE LIGAMENT REPAIR Left    Social History   Occupational History  . Not on file  Tobacco Use  . Smoking status: Never Smoker  . Smokeless tobacco: Never Used  Substance and Sexual Activity  . Alcohol use: Yes    Alcohol/week:  0.0 standard drinks    Comment: occ  . Drug use: No  . Sexual activity: Yes     Valeria Batman, MD   Note - This record has been created using [employer].  Chart creation errors have been sought, but may not always  have been located. Such creation errors do not reflect on  the standard of medical care.

## 2019-01-08 ENCOUNTER — Ambulatory Visit: Payer: BLUE CROSS/BLUE SHIELD | Admitting: Rehabilitative and Restorative Service Providers"

## 2019-01-13 ENCOUNTER — Ambulatory Visit: Payer: BLUE CROSS/BLUE SHIELD | Attending: Orthopaedic Surgery | Admitting: Physical Therapy

## 2019-01-13 ENCOUNTER — Other Ambulatory Visit: Payer: Self-pay

## 2019-01-13 ENCOUNTER — Encounter: Payer: Self-pay | Admitting: Physical Therapy

## 2019-01-13 DIAGNOSIS — G8929 Other chronic pain: Secondary | ICD-10-CM | POA: Diagnosis present

## 2019-01-13 DIAGNOSIS — M6281 Muscle weakness (generalized): Secondary | ICD-10-CM | POA: Diagnosis present

## 2019-01-13 DIAGNOSIS — M25512 Pain in left shoulder: Secondary | ICD-10-CM | POA: Diagnosis present

## 2019-01-13 NOTE — Therapy (Addendum)
[place], [place], [address] Phone: [phone]   Fax:  [phone]  Physical Therapy Evaluation/Discharge  Patient Details  Name: Christian Thornton MRN: [id number] Date of Birth: 04-10-1986 Referring Provider (PT): Dr Joni Fears   Encounter Date: 01/13/2019  PT End of Session - 01/13/19 1418    Visit Number  1    Number of Visits  2    Date for PT Re-Evaluation  01/27/19    Authorization Type  [employer] 30 visits    PT Start Time  0630    PT Stop Time  1458    PT Time Calculation (min)  40 min       History reviewed. No pertinent past medical history.  Past Surgical History:  Procedure Laterality Date  . ANTERIOR CRUCIATE LIGAMENT REPAIR Left     There were no vitals filed for this visit.   Subjective Assessment - 01/13/19 1509    Subjective  Pt reports insideous onset of Lt shoulder pain over the summer,  He does play a lot of golf.  Years ago he noticed popping in the shoulder, he rested and it went away.  This time he has increased achiness and limited motion.  Had an injection Sept of last year and the stiffness went away .  In November the pain returned and it was sharp in certain positions. Had a second injection last week after MRI results and his pain is gone.  He attempted to hit some golf balls and he had some discomfort.      Diagnostic tests  MRI mild tendonitis in supraspinatus    Patient Stated Goals  play golf without pain,     Currently in Pain?  No/denies         [facility] PT Assessment - 01/13/19 0001      Assessment   Medical Diagnosis  Lt shoulder pain     Referring Provider (PT)  Dr Joni Fears    Onset Date/Surgical Date  08/08/18    Hand Dominance  Right    Next MD Visit  PRN    Prior Therapy  not for shoulder , only for Lt ACL repair      Precautions   Precautions  None      Balance Screen   Has the patient fallen in the past 6 months  No      Prior Function    Level of Independence  Independent    Vocation  Full time employment    Vocation Requirements  desk job    Leisure  golf      Observation/Other Assessments   Focus on Therapeutic Outcomes (FOTO)   22% limited      Posture/Postural Control   Posture/Postural Control  No significant limitations      ROM / Strength   AROM / PROM / Strength  AROM;Strength      AROM   AROM Assessment Site  Shoulder;Cervical    Right/Left Shoulder  --   WNL, scapular catching with abduction LT    Cervical Flexion  WNl    Cervical Extension  WNL    Cervical - Right Rotation  WNL    Cervical - Left Rotation  WNL      Strength   Overall Strength Comments  mid back    Strength Assessment Site  Shoulder;Elbow    Right/Left Shoulder  --   WNL except LT ER 4+/5   Right/Left Elbow  --   WNL  Palpation   Palpation comment  point tenderness in Lt pec insertion area.                 Objective measurements completed on examination: See above findings.      [facility] PT Treatment/Exercise - 01/13/19 0001      Self-Care   Self-Care  Posture    Posture  effects of rounded shoulders tight pecs on GH shoulder mechanics.  Also reviewed shoulder kineisiology      Exercises   Exercises  Shoulder      Shoulder Exercises: Sidelying   External Rotation  Strengthening;Left;20 reps;Weights   towel under elbow   External Rotation Weight (lbs)  3      Shoulder Exercises: Standing   External Rotation  Strengthening;Left;15 reps;Theraband   towel under elbow   Theraband Level (Shoulder External Rotation)  Level 2 (Red)      Shoulder Exercises: Stretch   Other Shoulder Stretches  doorway low x 30 sec, attempted mid - had pain in the Lt shoulder so this was held.               PT Education - 01/13/19 1500    Education Details  HEP and shoulder mechanics    Person(s) Educated  Patient    Methods  Explanation;Demonstration;Handout    Comprehension  Returned demonstration;Verbalized  understanding;Verbal cues required          PT Long Term Goals - 01/13/19 1507      PT LONG TERM GOAL #1   Title  I with HEP ( 01/27/2019)     Time  2    Period  Weeks    Status  New    Target Date  01/27/19      PT LONG TERM GOAL #2   Title  no pain in Lt shoulder with going to the driving range ( 04/30/4331)     Time  2    Period  Weeks    Status  New    Target Date  01/27/19      PT LONG TERM GOAL #3   Title  Lt shoulder ER = to Rt without pain ( 01/27/2019)     Time  2    Period  Weeks    Status  New    Target Date  01/27/19      PT LONG TERM GOAL #4   Title  improve FOTO =/< 18% limited ( 01/27/2019)     Time  2    Period  Weeks    Status  New    Target Date  01/27/19             Plan - 01/13/19 1551    Clinical Impression Statement  Pt presents with ~ 6 month h/o Lt shoulder pain, he has had two injections and is now mostly painfree.  Only has pain with higher level activities and with hitting a golf ball.  He has some weakness in the Lt RTC along with impaired scapulothoracic rythm on that side.  Would benefit from instruction in HEP to reactivate and strengthen the RTC and upper back to reduce pain and restore painfree function.     Clinical Presentation  Stable    Clinical Decision Making  Low    Rehab Potential  Excellent    PT Frequency  Biweekly    PT Duration  2 weeks    PT Treatment/Interventions  Iontophoresis 43m/ml Dexamethasone;Cryotherapy;Therapeutic exercise;Patient/family education    PT Next Visit Plan  progress HEP if needed, patient may not need another visit, he will call if doing well with HEP and has returned to all activity     Consulted and Agree with Plan of Care  Patient       Patient will benefit from skilled therapeutic intervention in order to improve the following deficits and impairments:  Pain, Impaired UE functional use, Decreased strength  Visit Diagnosis: Chronic left shoulder pain - Plan: PT plan of care  cert/re-cert  Muscle weakness (generalized) - Plan: PT plan of care cert/re-cert     Problem List There are no active problems to display for this patient.   Jeral Pinch PT  01/13/2019, 4:06 PM  [facility] [address] [place], [place], [address] Phone: [phone]   Fax:  [phone]  Name: Roshaun Pound MRN: [id number] Date of Birth: [birth year]  PHYSICAL THERAPY DISCHARGE SUMMARY  Visits from Start of Care: 1  Current functional level related to goals / functional outcomes: Pt was doing well, presented for instruction for HEP. Was to call if he needed to return for visits   Remaining deficits: none   Education / Equipment: HEP Plan: Patient agrees to discharge.  Patient goals were not met. Patient is being discharged due to being pleased with the current functional level.  ?????    Jeral Pinch, PT 02/28/19 8:22 AM

## 2019-01-27 ENCOUNTER — Ambulatory Visit: Payer: BLUE CROSS/BLUE SHIELD | Admitting: Physical Therapy

## 2019-10-12 IMAGING — XA DG FLUORO GUIDE NDL PLC/BX
1 series · 1 of 1 positions shown · non-contrast
Comparison: none

CLINICAL DATA: LEFT shoulder pain.

[Series 1: ortho standard · 1 of 1 slices shown]
[im 1/1]
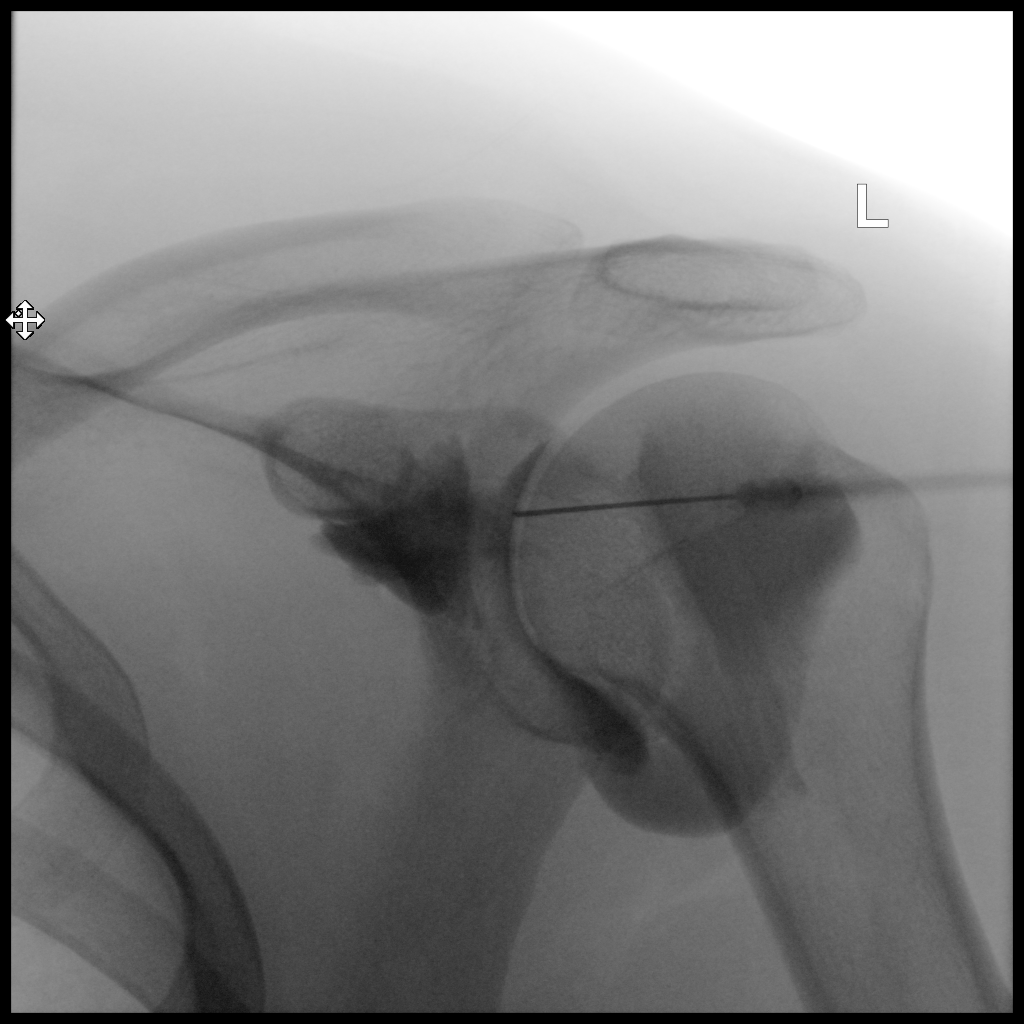

[1 of 1 positions shown; findings below may reference images not displayed]

FLUOROSCOPY TIME:  6 seconds corresponding to a Dose Area Product of
12.26 Gy*m2

PROCEDURE:
LEFT SHOULDER INJECTION UNDER FLUOROSCOPY

Informed written consent was obtained.  Time-out was performed.

An appropriate skin entrance site was determined. The site was
marked, prepped with Betadine, draped in the usual sterile fashion,
and infiltrated locally with 1% lidocaine. 22 gauge spinal needle
was advanced to the superomedial margin of the humeral head under
intermittent fluoroscopy. 1 ml of Lidocaine injected easily. A
mixture of 0.1 ml Multihance and 20 ml of dilute Omnipaque 180 was
then used to opacify the LEFT shoulder capsule. No immediate
complication.
IMPRESSION: Technically successful LEFT shoulder injection for MRI.

## 2019-10-12 IMAGING — MR MR SHOULDER*L* W/ CM
6 series · 40 of 40 positions shown · IV contrast (agent unspecified)
Comparison: None.

CLINICAL DATA: Left shoulder pain and weakness. Popping and
stiffness. Limited range of motion.

EXAM:
MR ARTHROGRAM OF THE LEFT SHOULDER
TECHNIQUE: Multiplanar, multisequence MR imaging of the left shoulder was
performed following the administration of intra-articular contrast.
CONTRAST:  See Injection Documentation.

[Series 3: T1 fat-sat · axial · 4.0mm · 0.27mm/px · z∈[-35,+48]mm · 6 of 18 slices shown (1 of 4)]
[im 1/18]
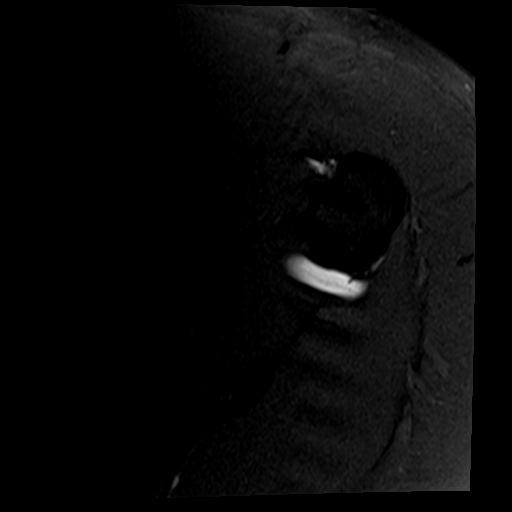
[im 4/18]
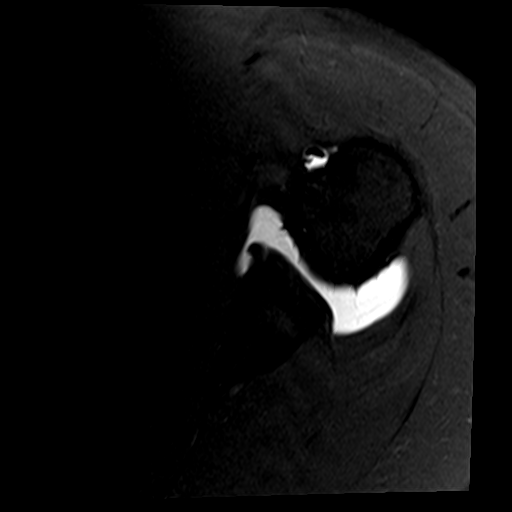
[im 7/18]
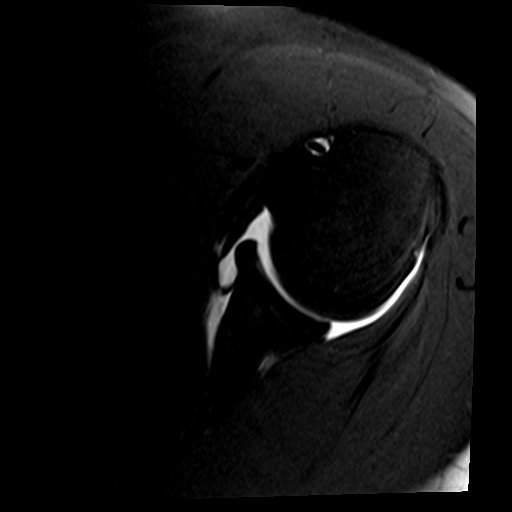
[im 11/18]
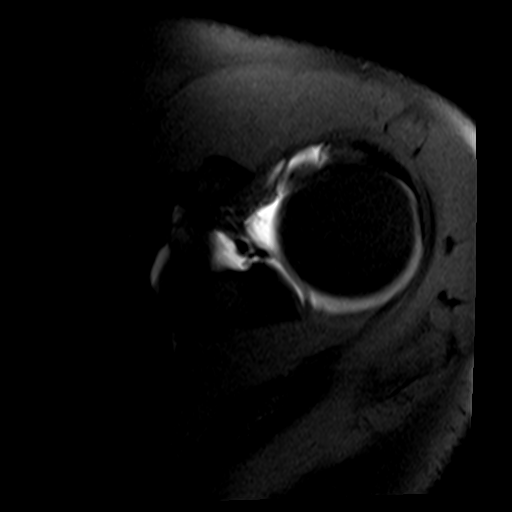
[im 14/18]
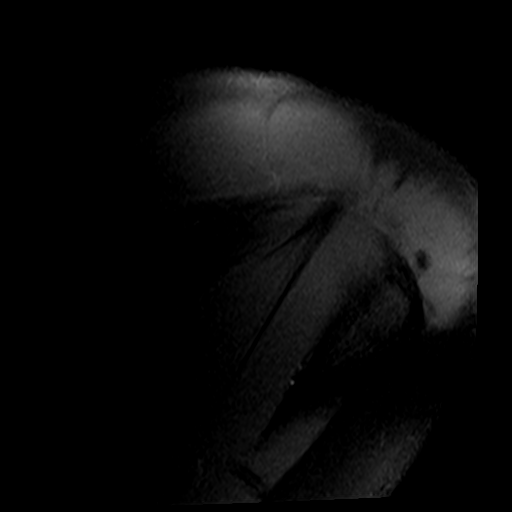
[im 18/18]
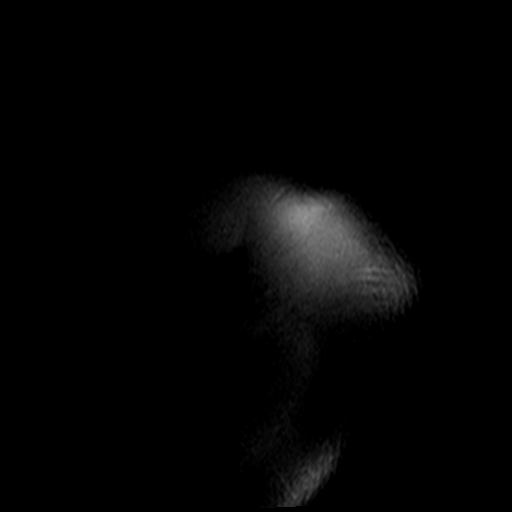

[Series 5: T1 fat-sat · sagittal · 4.0mm · 0.55mm/px · 6 of 18 slices shown (2 of 4)]
[im 1/18]
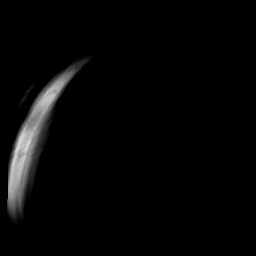
[im 4/18]
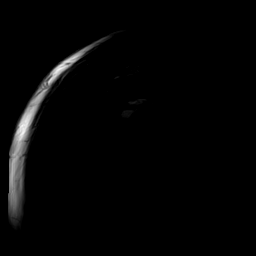
[im 7/18]
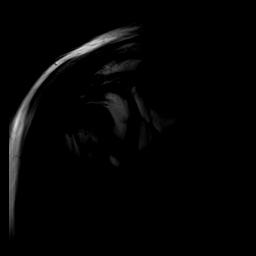
[im 11/18]
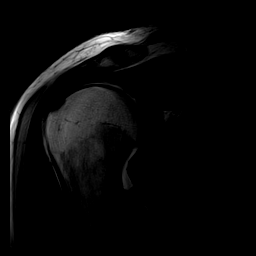
[im 14/18]
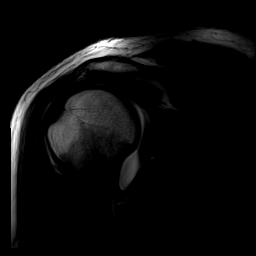
[im 18/18]
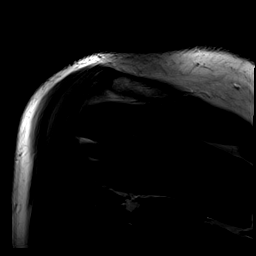

[Series 6: T1 fat-sat · sagittal · 4.0mm · 0.55mm/px · 7 of 18 slices shown (3 of 4)]
[im 1/18]
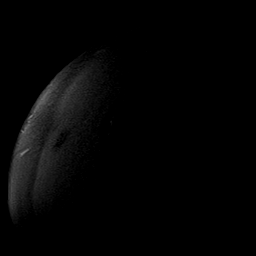
[im 3/18]
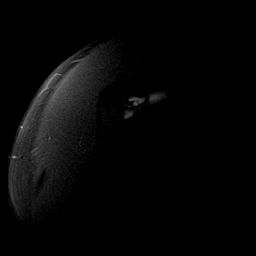
[im 6/18]
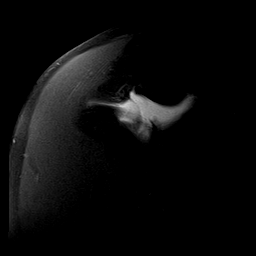
[im 9/18]
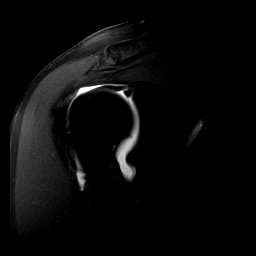
[im 12/18]
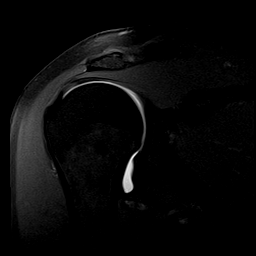
[im 15/18]
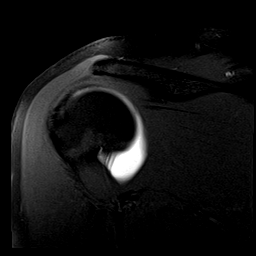
[im 18/18]
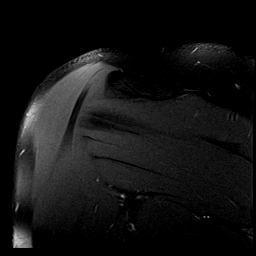

[Series 7: T2 fat-sat · sagittal · 4.0mm · 0.55mm/px · 7 of 18 slices shown (1 of 2)]
[im 1/18]
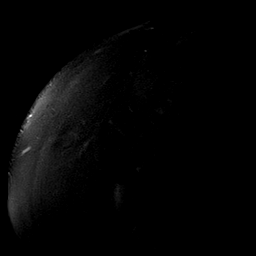
[im 3/18]
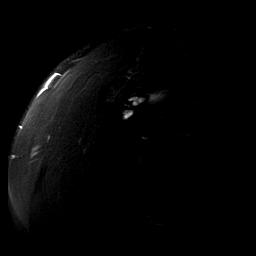
[im 6/18]
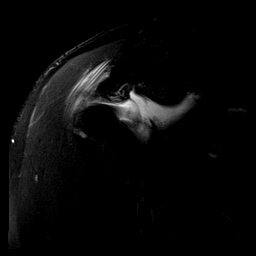
[im 9/18]
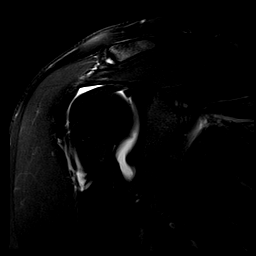
[im 12/18]
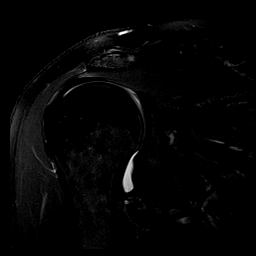
[im 15/18]
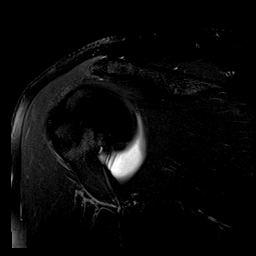
[im 18/18]
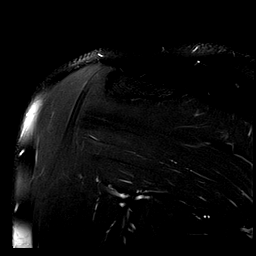

[Series 8: T2 fat-sat · coronal · 4.0mm · 0.55mm/px · 8 of 22 slices shown (2 of 2)]
[im 1/22]
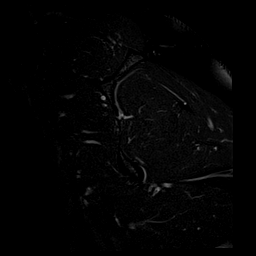
[im 4/22]
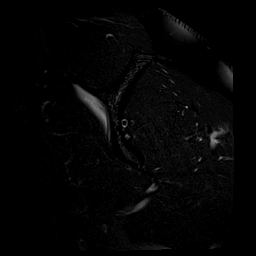
[im 7/22]
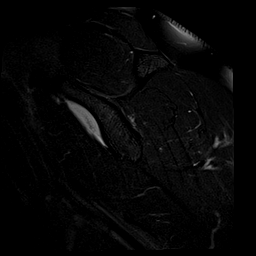
[im 10/22]
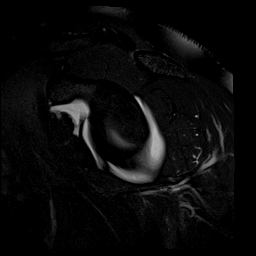
[im 13/22]
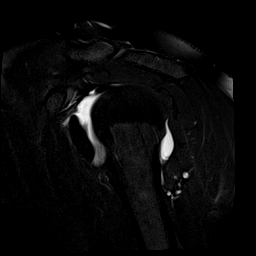
[im 16/22]
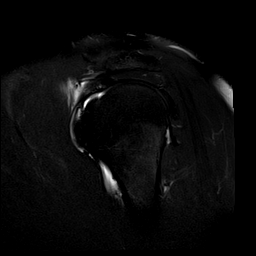
[im 19/22]
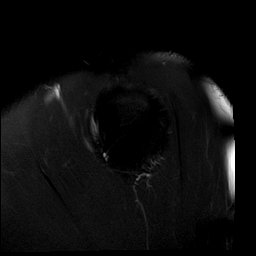
[im 22/22]
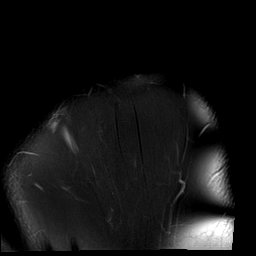

[Series 11: T1 fat-sat · sagittal · 4.0mm · 0.59mm/px · 6 of 16 slices shown (4 of 4)]
[im 1/16]
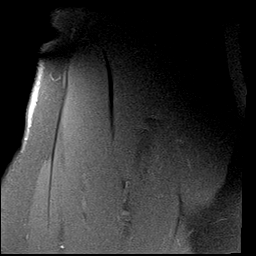
[im 4/16]
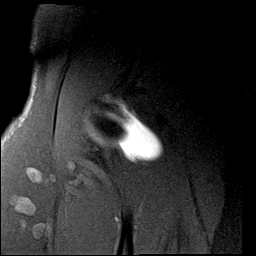
[im 7/16]
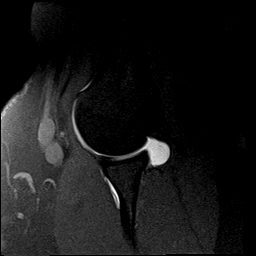
[im 10/16]
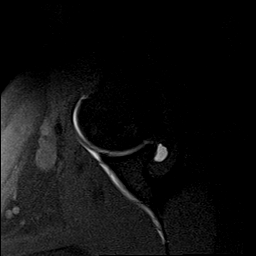
[im 13/16]
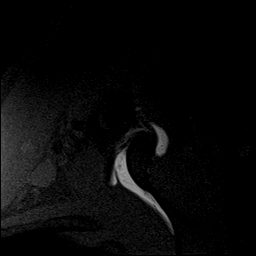
[im 16/16]
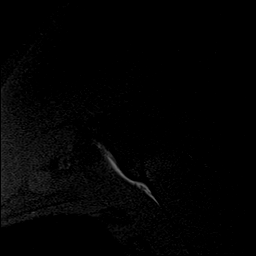

[40 of 40 positions shown; findings below may reference images not displayed]

FINDINGS: Rotator cuff: Mild tendinosis of the supraspinatus tendon without a
tear. Infraspinatus tendon is intact. Teres minor tendon is intact.
Subscapularis tendon is intact.

Muscles: No atrophy or fatty replacement of nor abnormal signal
within, the muscles of the rotator cuff.

Biceps long head: Intact.

Acromioclavicular Joint: Normal acromioclavicular joint. Type II
acromion. No significant subacromial/subdeltoid bursal fluid or
contrast.

Glenohumeral Joint: Intraarticular contrast distending the joint
capsule. No chondral defect.

Labrum: Intact.

Bones: No acute osseous abnormality.  No aggressive osseous lesion.
IMPRESSION: 1. Mild tendinosis of the supraspinatus tendon without a tear.

## 2020-01-30 ENCOUNTER — Ambulatory Visit: Payer: BC Managed Care – PPO | Attending: Internal Medicine

## 2020-01-30 DIAGNOSIS — Z20822 Contact with and (suspected) exposure to covid-19: Secondary | ICD-10-CM

## 2020-01-31 LAB — NOVEL CORONAVIRUS, NAA: SARS-CoV-2, NAA: NOT DETECTED

## 2020-02-27 ENCOUNTER — Ambulatory Visit: Payer: BC Managed Care – PPO

## 2020-02-27 ENCOUNTER — Ambulatory Visit: Payer: BC Managed Care – PPO | Attending: Internal Medicine

## 2020-02-27 DIAGNOSIS — Z23 Encounter for immunization: Secondary | ICD-10-CM

## 2020-02-27 NOTE — Progress Notes (Signed)
   Covid-19 Vaccination Clinic  Name:  Christian Thornton    MRN: [id number] DOB: 15-Jan-1986  02/27/2020  [name] was observed post Covid-19 immunization for 15 minutes without incident. He was provided with Vaccine Information Sheet and instruction to access the V-Safe system.   [name] was instructed to call 911 with any severe reactions post vaccine: Marland Kitchen Difficulty breathing  . Swelling of face and throat  . A fast heartbeat  . A bad rash all over body  . Dizziness and weakness

## 2020-03-24 ENCOUNTER — Ambulatory Visit: Payer: BC Managed Care – PPO | Attending: Internal Medicine

## 2020-03-24 DIAGNOSIS — Z23 Encounter for immunization: Secondary | ICD-10-CM

## 2020-03-24 NOTE — Progress Notes (Signed)
   Covid-19 Vaccination Clinic  Name:  Christian Thornton    MRN: [id number] DOB: [birth year]  03/24/2020  [name] was observed post Covid-19 immunization for 15 minutes without incident. He was provided with Vaccine Information Sheet and instruction to access the V-Safe system.   [name] was instructed to call 911 with any severe reactions post vaccine: Marland Kitchen Difficulty breathing  . Swelling of face and throat  . A fast heartbeat  . A bad rash all over body  . Dizziness and weakness   Immunizations Administered    Name Date Dose VIS Date Route   Pfizer COVID-19 Vaccine 03/24/2020  3:04 PM 0.3 mL 12/05/2019 Intramuscular   Manufacturer: [employer], [employer]   Lot: [id number]   NDC: [address]-4

## 2020-08-11 ENCOUNTER — Other Ambulatory Visit: Payer: Self-pay | Admitting: Critical Care Medicine

## 2020-08-11 ENCOUNTER — Other Ambulatory Visit: Payer: Self-pay

## 2020-08-11 DIAGNOSIS — Z20822 Contact with and (suspected) exposure to covid-19: Secondary | ICD-10-CM

## 2020-08-12 LAB — SARS-COV-2, NAA 2 DAY TAT

## 2020-08-12 LAB — NOVEL CORONAVIRUS, NAA: SARS-CoV-2, NAA: NOT DETECTED

## 2020-12-20 ENCOUNTER — Other Ambulatory Visit: Payer: Self-pay

## 2020-12-20 DIAGNOSIS — Z20822 Contact with and (suspected) exposure to covid-19: Secondary | ICD-10-CM

## 2020-12-21 LAB — NOVEL CORONAVIRUS, NAA: SARS-CoV-2, NAA: NOT DETECTED

## 2020-12-21 LAB — SARS-COV-2, NAA 2 DAY TAT

## 2022-07-17 DIAGNOSIS — J40 Bronchitis, not specified as acute or chronic: Secondary | ICD-10-CM | POA: Diagnosis not present

## 2022-07-17 DIAGNOSIS — R0602 Shortness of breath: Secondary | ICD-10-CM | POA: Diagnosis not present

## 2022-07-17 DIAGNOSIS — G4483 Primary cough headache: Secondary | ICD-10-CM | POA: Diagnosis not present

## 2022-07-17 DIAGNOSIS — Z20822 Contact with and (suspected) exposure to covid-19: Secondary | ICD-10-CM | POA: Diagnosis not present

## 2022-07-17 DIAGNOSIS — R059 Cough, unspecified: Secondary | ICD-10-CM | POA: Diagnosis not present

## 2022-11-22 DIAGNOSIS — M24811 Other specific joint derangements of right shoulder, not elsewhere classified: Secondary | ICD-10-CM | POA: Diagnosis not present

## 2023-01-03 DIAGNOSIS — M25511 Pain in right shoulder: Secondary | ICD-10-CM | POA: Diagnosis not present

## 2023-01-18 DIAGNOSIS — M7541 Impingement syndrome of right shoulder: Secondary | ICD-10-CM | POA: Diagnosis not present

## 2023-01-18 DIAGNOSIS — M25511 Pain in right shoulder: Secondary | ICD-10-CM | POA: Diagnosis not present

## 2023-01-18 DIAGNOSIS — R531 Weakness: Secondary | ICD-10-CM | POA: Diagnosis not present

## 2023-01-18 DIAGNOSIS — M25311 Other instability, right shoulder: Secondary | ICD-10-CM | POA: Diagnosis not present

## 2023-01-23 DIAGNOSIS — R531 Weakness: Secondary | ICD-10-CM | POA: Diagnosis not present

## 2023-01-23 DIAGNOSIS — M7541 Impingement syndrome of right shoulder: Secondary | ICD-10-CM | POA: Diagnosis not present

## 2023-01-23 DIAGNOSIS — M25511 Pain in right shoulder: Secondary | ICD-10-CM | POA: Diagnosis not present

## 2023-01-23 DIAGNOSIS — M25311 Other instability, right shoulder: Secondary | ICD-10-CM | POA: Diagnosis not present

## 2023-01-25 DIAGNOSIS — M7541 Impingement syndrome of right shoulder: Secondary | ICD-10-CM | POA: Diagnosis not present

## 2023-01-25 DIAGNOSIS — M25511 Pain in right shoulder: Secondary | ICD-10-CM | POA: Diagnosis not present

## 2023-01-25 DIAGNOSIS — M25311 Other instability, right shoulder: Secondary | ICD-10-CM | POA: Diagnosis not present

## 2023-01-25 DIAGNOSIS — R531 Weakness: Secondary | ICD-10-CM | POA: Diagnosis not present

## 2023-01-30 DIAGNOSIS — M7541 Impingement syndrome of right shoulder: Secondary | ICD-10-CM | POA: Diagnosis not present

## 2023-01-30 DIAGNOSIS — M25511 Pain in right shoulder: Secondary | ICD-10-CM | POA: Diagnosis not present

## 2023-01-30 DIAGNOSIS — R531 Weakness: Secondary | ICD-10-CM | POA: Diagnosis not present

## 2023-01-30 DIAGNOSIS — M25311 Other instability, right shoulder: Secondary | ICD-10-CM | POA: Diagnosis not present

## 2023-02-01 DIAGNOSIS — R531 Weakness: Secondary | ICD-10-CM | POA: Diagnosis not present

## 2023-02-01 DIAGNOSIS — M25511 Pain in right shoulder: Secondary | ICD-10-CM | POA: Diagnosis not present

## 2023-02-01 DIAGNOSIS — M7541 Impingement syndrome of right shoulder: Secondary | ICD-10-CM | POA: Diagnosis not present

## 2023-02-01 DIAGNOSIS — M25311 Other instability, right shoulder: Secondary | ICD-10-CM | POA: Diagnosis not present

## 2023-02-09 DIAGNOSIS — R531 Weakness: Secondary | ICD-10-CM | POA: Diagnosis not present

## 2023-02-09 DIAGNOSIS — M7541 Impingement syndrome of right shoulder: Secondary | ICD-10-CM | POA: Diagnosis not present

## 2023-02-09 DIAGNOSIS — M25311 Other instability, right shoulder: Secondary | ICD-10-CM | POA: Diagnosis not present

## 2023-02-09 DIAGNOSIS — M25511 Pain in right shoulder: Secondary | ICD-10-CM | POA: Diagnosis not present

## 2023-02-12 DIAGNOSIS — M25311 Other instability, right shoulder: Secondary | ICD-10-CM | POA: Diagnosis not present

## 2023-02-12 DIAGNOSIS — M25511 Pain in right shoulder: Secondary | ICD-10-CM | POA: Diagnosis not present

## 2023-02-12 DIAGNOSIS — R531 Weakness: Secondary | ICD-10-CM | POA: Diagnosis not present

## 2023-02-12 DIAGNOSIS — M7541 Impingement syndrome of right shoulder: Secondary | ICD-10-CM | POA: Diagnosis not present

## 2023-02-19 DIAGNOSIS — M24811 Other specific joint derangements of right shoulder, not elsewhere classified: Secondary | ICD-10-CM | POA: Diagnosis not present

## 2023-02-28 DIAGNOSIS — M25311 Other instability, right shoulder: Secondary | ICD-10-CM | POA: Diagnosis not present

## 2023-02-28 DIAGNOSIS — M25511 Pain in right shoulder: Secondary | ICD-10-CM | POA: Diagnosis not present

## 2023-02-28 DIAGNOSIS — M7541 Impingement syndrome of right shoulder: Secondary | ICD-10-CM | POA: Diagnosis not present

## 2023-02-28 DIAGNOSIS — R531 Weakness: Secondary | ICD-10-CM | POA: Diagnosis not present

## 2023-03-09 DIAGNOSIS — M25511 Pain in right shoulder: Secondary | ICD-10-CM | POA: Diagnosis not present

## 2023-03-09 DIAGNOSIS — M25311 Other instability, right shoulder: Secondary | ICD-10-CM | POA: Diagnosis not present

## 2023-03-09 DIAGNOSIS — R531 Weakness: Secondary | ICD-10-CM | POA: Diagnosis not present

## 2023-03-09 DIAGNOSIS — M7541 Impingement syndrome of right shoulder: Secondary | ICD-10-CM | POA: Diagnosis not present

## 2023-03-20 DIAGNOSIS — M19011 Primary osteoarthritis, right shoulder: Secondary | ICD-10-CM | POA: Diagnosis not present

## 2023-03-20 DIAGNOSIS — S43491A Other sprain of right shoulder joint, initial encounter: Secondary | ICD-10-CM | POA: Diagnosis not present

## 2023-03-20 DIAGNOSIS — G8918 Other acute postprocedural pain: Secondary | ICD-10-CM | POA: Diagnosis not present

## 2023-03-20 DIAGNOSIS — M75111 Incomplete rotator cuff tear or rupture of right shoulder, not specified as traumatic: Secondary | ICD-10-CM | POA: Diagnosis not present

## 2023-03-20 DIAGNOSIS — M7551 Bursitis of right shoulder: Secondary | ICD-10-CM | POA: Diagnosis not present

## 2023-03-20 DIAGNOSIS — S43431A Superior glenoid labrum lesion of right shoulder, initial encounter: Secondary | ICD-10-CM | POA: Diagnosis not present

## 2023-04-06 DIAGNOSIS — M25512 Pain in left shoulder: Secondary | ICD-10-CM | POA: Diagnosis not present

## 2023-04-11 DIAGNOSIS — M25512 Pain in left shoulder: Secondary | ICD-10-CM | POA: Diagnosis not present

## 2023-04-13 DIAGNOSIS — M25512 Pain in left shoulder: Secondary | ICD-10-CM | POA: Diagnosis not present

## 2023-04-18 DIAGNOSIS — M25512 Pain in left shoulder: Secondary | ICD-10-CM | POA: Diagnosis not present

## 2023-04-20 DIAGNOSIS — M25512 Pain in left shoulder: Secondary | ICD-10-CM | POA: Diagnosis not present

## 2023-04-25 DIAGNOSIS — M25512 Pain in left shoulder: Secondary | ICD-10-CM | POA: Diagnosis not present

## 2023-04-27 DIAGNOSIS — M25512 Pain in left shoulder: Secondary | ICD-10-CM | POA: Diagnosis not present

## 2023-05-09 DIAGNOSIS — M25512 Pain in left shoulder: Secondary | ICD-10-CM | POA: Diagnosis not present

## 2023-05-11 DIAGNOSIS — M25512 Pain in left shoulder: Secondary | ICD-10-CM | POA: Diagnosis not present

## 2023-06-27 DIAGNOSIS — D2262 Melanocytic nevi of left upper limb, including shoulder: Secondary | ICD-10-CM | POA: Diagnosis not present

## 2023-06-27 DIAGNOSIS — D225 Melanocytic nevi of trunk: Secondary | ICD-10-CM | POA: Diagnosis not present

## 2023-06-27 DIAGNOSIS — D485 Neoplasm of uncertain behavior of skin: Secondary | ICD-10-CM | POA: Diagnosis not present

## 2023-07-25 DIAGNOSIS — D2262 Melanocytic nevi of left upper limb, including shoulder: Secondary | ICD-10-CM | POA: Diagnosis not present

## 2023-07-25 DIAGNOSIS — L905 Scar conditions and fibrosis of skin: Secondary | ICD-10-CM | POA: Diagnosis not present

## 2023-07-25 DIAGNOSIS — D485 Neoplasm of uncertain behavior of skin: Secondary | ICD-10-CM | POA: Diagnosis not present

## 2023-12-24 DIAGNOSIS — D225 Melanocytic nevi of trunk: Secondary | ICD-10-CM | POA: Diagnosis not present

## 2023-12-24 DIAGNOSIS — Z1283 Encounter for screening for malignant neoplasm of skin: Secondary | ICD-10-CM | POA: Diagnosis not present

## 2024-01-24 DIAGNOSIS — J029 Acute pharyngitis, unspecified: Secondary | ICD-10-CM | POA: Diagnosis not present

## 2024-01-24 DIAGNOSIS — R051 Acute cough: Secondary | ICD-10-CM | POA: Diagnosis not present

## 2024-02-04 DIAGNOSIS — Z20828 Contact with and (suspected) exposure to other viral communicable diseases: Secondary | ICD-10-CM | POA: Diagnosis not present

## 2024-02-04 DIAGNOSIS — J101 Influenza due to other identified influenza virus with other respiratory manifestations: Secondary | ICD-10-CM | POA: Diagnosis not present

## 2024-04-07 DIAGNOSIS — I7300 Raynaud's syndrome without gangrene: Secondary | ICD-10-CM | POA: Diagnosis not present

## 2024-04-07 DIAGNOSIS — Z1331 Encounter for screening for depression: Secondary | ICD-10-CM | POA: Diagnosis not present

## 2024-11-03 DIAGNOSIS — J029 Acute pharyngitis, unspecified: Secondary | ICD-10-CM | POA: Diagnosis not present

## 2024-11-24 DIAGNOSIS — I7300 Raynaud's syndrome without gangrene: Secondary | ICD-10-CM | POA: Diagnosis not present

## 2024-11-24 DIAGNOSIS — Z1331 Encounter for screening for depression: Secondary | ICD-10-CM | POA: Diagnosis not present

## 2024-11-24 DIAGNOSIS — R82998 Other abnormal findings in urine: Secondary | ICD-10-CM | POA: Diagnosis not present

## 2024-11-24 DIAGNOSIS — Z1389 Encounter for screening for other disorder: Secondary | ICD-10-CM | POA: Diagnosis not present

## 2024-11-24 DIAGNOSIS — Z0000 Encounter for general adult medical examination without abnormal findings: Secondary | ICD-10-CM | POA: Diagnosis not present

## 2024-11-24 DIAGNOSIS — Z23 Encounter for immunization: Secondary | ICD-10-CM | POA: Diagnosis not present
